# Patient Record
Sex: Male | Born: 1965 | Race: White | Hispanic: No | Marital: Married | Smoking: Never smoker
Health system: Southern US, Community
[De-identification: ages and names within clinical notes are randomized; demographics above are authoritative.]

---

## 2012-10-24 ENCOUNTER — Emergency Department: Payer: Self-pay | Admitting: Emergency Medicine

## 2012-10-25 ENCOUNTER — Emergency Department: Payer: Self-pay | Admitting: Emergency Medicine

## 2013-05-27 ENCOUNTER — Emergency Department: Payer: Self-pay | Admitting: Emergency Medicine

## 2013-09-09 ENCOUNTER — Ambulatory Visit: Payer: Self-pay | Admitting: Specialist

## 2015-04-27 ENCOUNTER — Emergency Department: Payer: Medicaid Other

## 2015-04-27 ENCOUNTER — Emergency Department
Admission: EM | Admit: 2015-04-27 | Discharge: 2015-04-27 | Disposition: A | Discharge status: Home / Self Care | Payer: Medicaid Other | Attending: Emergency Medicine | Admitting: Emergency Medicine

## 2015-04-27 ENCOUNTER — Other Ambulatory Visit: Payer: Self-pay

## 2015-04-27 ENCOUNTER — Encounter: Payer: Self-pay | Admitting: Emergency Medicine

## 2015-04-27 DIAGNOSIS — Z791 Long term (current) use of non-steroidal anti-inflammatories (NSAID): Secondary | ICD-10-CM | POA: Insufficient documentation

## 2015-04-27 DIAGNOSIS — R1031 Right lower quadrant pain: Secondary | ICD-10-CM

## 2015-04-27 DIAGNOSIS — J181 Lobar pneumonia, unspecified organism: Secondary | ICD-10-CM

## 2015-04-27 DIAGNOSIS — J189 Pneumonia, unspecified organism: Secondary | ICD-10-CM | POA: Diagnosis not present

## 2015-04-27 DIAGNOSIS — Z792 Long term (current) use of antibiotics: Secondary | ICD-10-CM | POA: Diagnosis not present

## 2015-04-27 LAB — CBC WITH DIFFERENTIAL/PLATELET
Basophils Absolute: 0.1 10*3/uL (ref 0–0.1)
Basophils Relative: 1 %
Eosinophils Absolute: 0.8 10*3/uL — ABNORMAL HIGH (ref 0–0.7)
Eosinophils Relative: 8 %
HCT: 43.5 % (ref 40.0–52.0)
Hemoglobin: 14.4 g/dL (ref 13.0–18.0)
LYMPHS ABS: 3.4 10*3/uL (ref 1.0–3.6)
Lymphocytes Relative: 37 %
MCH: 28.3 pg (ref 26.0–34.0)
MCHC: 33.1 g/dL (ref 32.0–36.0)
MCV: 85.6 fL (ref 80.0–100.0)
Monocytes Absolute: 0.7 10*3/uL (ref 0.2–1.0)
Monocytes Relative: 7 %
NEUTROS ABS: 4.3 10*3/uL (ref 1.4–6.5)
NEUTROS PCT: 47 %
Platelets: 277 10*3/uL (ref 150–440)
RBC: 5.09 MIL/uL (ref 4.40–5.90)
RDW: 14.4 % (ref 11.5–14.5)
WBC: 9.2 10*3/uL (ref 3.8–10.6)

## 2015-04-27 LAB — URINALYSIS COMPLETE WITH MICROSCOPIC (ARMC ONLY)
BILIRUBIN URINE: NEGATIVE
Bacteria, UA: NONE SEEN
Glucose, UA: NEGATIVE mg/dL
HGB URINE DIPSTICK: NEGATIVE
KETONES UR: NEGATIVE mg/dL
Leukocytes, UA: NEGATIVE
NITRITE: NEGATIVE
Protein, ur: NEGATIVE mg/dL
RBC / HPF: NONE SEEN RBC/hpf (ref 0–5)
SPECIFIC GRAVITY, URINE: 1.009 (ref 1.005–1.030)
WBC UA: NONE SEEN WBC/hpf (ref 0–5)
pH: 7 (ref 5.0–8.0)

## 2015-04-27 LAB — COMPREHENSIVE METABOLIC PANEL
ALT: 20 U/L (ref 17–63)
AST: 23 U/L (ref 15–41)
Albumin: 4.1 g/dL (ref 3.5–5.0)
Alkaline Phosphatase: 90 U/L (ref 38–126)
Anion gap: 10 (ref 5–15)
BUN: 12 mg/dL (ref 6–20)
CALCIUM: 9 mg/dL (ref 8.9–10.3)
CO2: 26 mmol/L (ref 22–32)
CREATININE: 0.89 mg/dL (ref 0.61–1.24)
Chloride: 103 mmol/L (ref 101–111)
GFR calc Af Amer: >60 mL/min (ref >60)
Glucose, Bld: 106 mg/dL — ABNORMAL HIGH (ref 65–99)
Potassium: 3.8 mmol/L (ref 3.5–5.1)
SODIUM: 139 mmol/L (ref 135–145)
Total Bilirubin: 0.4 mg/dL (ref 0.3–1.2)
Total Protein: 6.9 g/dL (ref 6.5–8.1)

## 2015-04-27 LAB — LIPASE, BLOOD: Lipase: 30 U/L (ref 22–51)

## 2015-04-27 MED ORDER — LEVOFLOXACIN 750 MG PO TABS: 750.0000 mg | ORAL_TABLET | Freq: Every day | ORAL | Status: AC

## 2015-04-27 MED ORDER — ONDANSETRON 4 MG PO TBDP: 8.0000 mg | ORAL_TABLET | ORAL | Status: DC

## 2015-04-27 MED ORDER — ONDANSETRON 8 MG PO TBDP
ORAL_TABLET | ORAL | Status: AC
  Filled 2015-04-27: qty 1

## 2015-04-27 MED ORDER — NAPROXEN 500 MG PO TABS: 500.0000 mg | ORAL_TABLET | Freq: Two times a day (BID) | ORAL | Status: AC

## 2015-04-27 NOTE — ED Provider Notes (Signed)
[facility] Emergency Department Provider Note  ____________________________________________  Time seen: 4:45 PM  I have reviewed the triage vital signs and the nursing notes.   HISTORY  Chief Complaint Abdominal Pain    HPI Mason Huber is a 49 y.o. male who complains of right lower quadrant abdominal pain since yesterday. The pain initially started in the mid and lower abdomen, but then as the day has gone today. It is glasses off to the right lower quadrant. It is waxing and waning currently mild but has been very severe at times. Normal appetite. No vomiting or diarrhea. No fever, chills, chest pain, shortness of breath. He did have a cough for the last 2 days that is persistent and has been feeling fatigued for 24 hours.  The pain is constant, waxing and waning, sharp, nonradiating. No aggravating or alleviating factors.   History reviewed. No pertinent past medical history.  There are no active problems to display for this patient.   History reviewed. No pertinent past surgical history.  Current Outpatient Rx  Name  Route  Sig  Dispense  Refill  . levofloxacin (LEVAQUIN) 750 MG tablet   Oral   Take 1 tablet (750 mg total) by mouth daily.   7 tablet   0   . naproxen (NAPROSYN) 500 MG tablet   Oral   Take 1 tablet (500 mg total) by mouth 2 (two) times daily with a meal.   20 tablet   0     Allergies Review of patient's allergies indicates no known allergies.  History reviewed. No pertinent family history.  Social History History  Substance Use Topics  . Smoking status: Never Smoker   . Smokeless tobacco: Not on file  . Alcohol Use: No    Review of Systems  Constitutional: No fever or chills. No weight changes Eyes:No blurry vision or double vision.  ENT: No sore throat. Cardiovascular: No chest pain. Respiratory: Nonproductive cough Gastrointestinal: Right lower quadrant abdominal pain as above. No vomiting or diarrhea.   No BRBPR or melena. Genitourinary: Negative for dysuria, urinary retention, bloody urine, or difficulty urinating. Musculoskeletal: Negative for back pain. No joint swelling or pain. Skin: Negative for rash. Neurological: Negative for headaches, focal weakness or numbness. Psychiatric:No anxiety or depression.   Endocrine:No hot/cold intolerance, changes in energy, or sleep difficulty.  10-point ROS otherwise negative.  ____________________________________________   PHYSICAL EXAM:  VITAL SIGNS: ED Triage Vitals  Enc Vitals Group     BP 04/27/15 1600 147/94 mmHg     Pulse Rate 04/27/15 1600 79     Resp 04/27/15 1600 18     Temp 04/27/15 1600 98.6 F (37 C)     Temp Source 04/27/15 1600 Oral     SpO2 04/27/15 1600 97 %     Weight 04/27/15 1600 185 lb (83.915 kg)     Height 04/27/15 1600 5\' 6"  (1.676 m)     Head Cir --      Peak Flow --      Pain Score 04/27/15 1601 1     Pain Loc --      Pain Edu? --      Excl. in GC? --      Constitutional: Alert and oriented. Well appearing and in no distress. Eyes: No scleral icterus. No conjunctival pallor. PERRL. EOMI ENT   Head: Normocephalic and atraumatic.   Nose: No congestion/rhinnorhea. No septal hematoma   Mouth/Throat: MMM, no pharyngeal erythema. No peritonsillar mass. No uvula shift.  Neck: No stridor. No SubQ emphysema. No meningismus. Hematological/Lymphatic/Immunilogical: No cervical lymphadenopathy. Cardiovascular: RRR. Normal and symmetric distal pulses are present in all extremities. No murmurs, rubs, or gallops. Respiratory: Normal respiratory effort without tachypnea nor retractions. Breath sounds are clear and equal bilaterally. No wheezes/rales/rhonchi. Gastrointestinal: Right lower quadrant abdominal tenderness. No distention. There is no CVA tenderness.  No rebound, rigidity, or guarding. Genitourinary: deferred Musculoskeletal: Nontender with normal range of motion in all extremities. No joint  effusions.  No lower extremity tenderness.  No edema. Neurologic:   Normal speech and language.  CN 2-10 normal. Motor grossly intact. No pronator drift.  Normal gait. No gross focal neurologic deficits are appreciated.  Skin:  Skin is warm, dry and intact. No rash noted.  No petechiae, purpura, or bullae. Psychiatric: Mood and affect are normal. Speech and behavior are normal. Patient exhibits appropriate insight and judgment.  ____________________________________________    LABS (pertinent positives/negatives) (all labs ordered are listed, but only abnormal results are displayed) Labs Reviewed  COMPREHENSIVE METABOLIC PANEL - Abnormal; Notable for the following:    Glucose, Bld 106 (*)    All other components within normal limits  CBC WITH DIFFERENTIAL/PLATELET - Abnormal; Notable for the following:    Eosinophils Absolute 0.8 (*)    All other components within normal limits  LIPASE, BLOOD  URINALYSIS COMPLETEWITH MICROSCOPIC ([facility])    ____________________________________________   EKG   Date: 04/27/2015  Rate: 74  Rhythm: normal sinus rhythm  QRS Axis: normal  Intervals: normal  ST/T Wave abnormalities: normal  Conduction Disutrbances: none  Narrative Interpretation: unremarkable      ____________________________________________    RADIOLOGY  CT abdomen and pelvis unremarkable. No appendicitis or other acute pathology. No ureterolithiasis. I discussed with the radiologist who did note there is a right lower lobe pneumonia on the visualized portions of the lower lungs.  ____________________________________________   PROCEDURES  ____________________________________________   INITIAL IMPRESSION / ASSESSMENT AND PLAN / ED COURSE  Pertinent labs & imaging results that were available during my care of the patient were reviewed by me and considered in my medical decision making (see chart for details).  Patient presents with concern for appendicitis  versus renal colic. We'll get a CT scan, as well as check his blood work.  ----------------------------------------- 6:41 PM on 04/27/2015 -----------------------------------------  Patient well appearing, no acute distress, has been stable during his workup. His blood work and CT scan unremarkable except for the incidental finding of pneumonia in the right lung. Given his mild symptoms. We will go ahead and treat him. He has been counseled on possible early appendicitis and the need to monitor his symptoms and to return to the ED if his condition worsens. He is otherwise medically stable and suitable for discharge at this time. No evidence of torsion, volvulus, sepsis, abscess, cholecystitis, AAA, dissection or other cardiopulmonary pathology other than the incidental pneumonia.  ____________________________________________   FINAL CLINICAL IMPRESSION(S) / ED DIAGNOSES  Final diagnoses:  Abdominal pain, acute, right lower quadrant  Right lower lobe pneumonia      Sharman CheekPhillip Chidiebere Wynn, MD 04/27/15 1842

## 2015-04-27 NOTE — ED Notes (Signed)
Pt reports right groin pain and  RLQ pain off and on since yesterday.  States it is a 1/10 right now but sometimes a 8/20.  Skin w/d with good color

## 2015-04-27 NOTE — Discharge Instructions (Signed)

## 2016-04-30 IMAGING — CT CT RENAL STONE PROTOCOL
1 of 2 series · 15 of 32 positions shown, 19 images · non-contrast
Comparison: Lumbar radiographs dated 10/24/2012

ADDENDUM:
The patient has a patchy pneumonia in the right lower lobe just
above the right hemidiaphragm. Critical Value/emergent results were
called by telephone at the time of interpretation on 04/27/2015 at
[DATE] to Dr. NORALIA REBECCA , who verbally acknowledged these
results.
CLINICAL DATA: Right flank and groin pain with tenderness.

EXAM:
CT ABDOMEN AND PELVIS WITHOUT CONTRAST
TECHNIQUE: Multidetector CT imaging of the abdomen and pelvis was performed
following the standard protocol without IV contrast.

[Series 2: stone standard full · axial · 0.71mm/px · z∈[-1057,-642]mm · 15 of 91 slices shown, 19 images]
[im 4/91  soft-tissue]
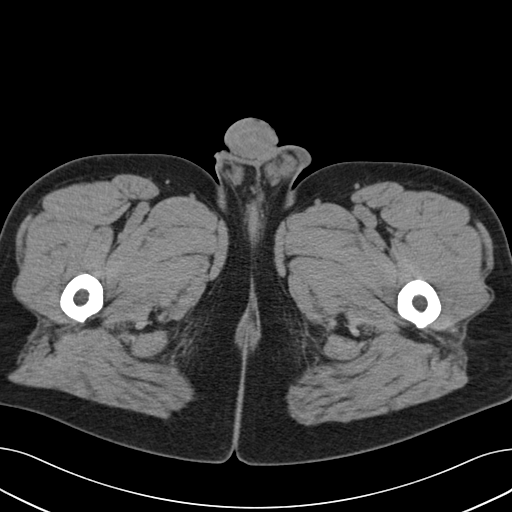
[im 4/91  bone]
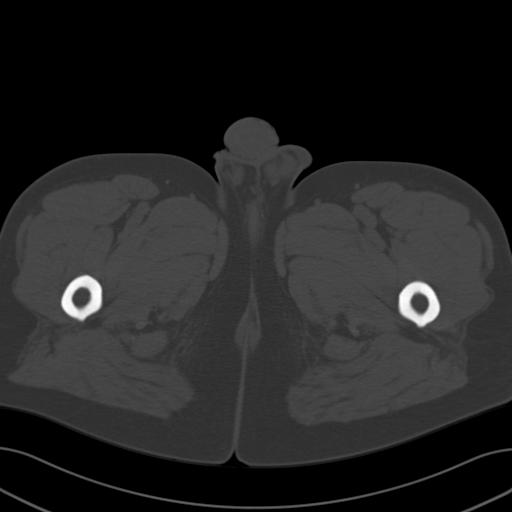
[im 12/91  soft-tissue]
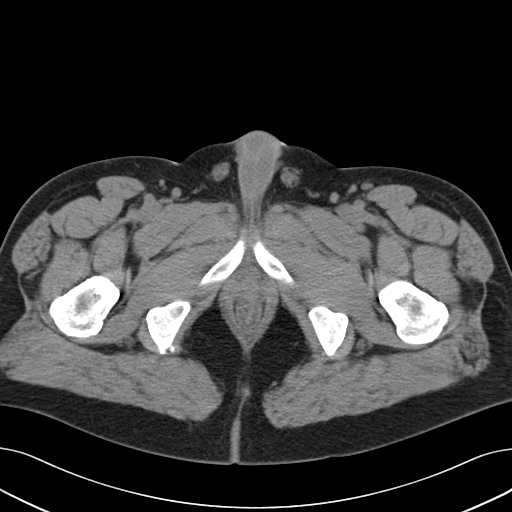
[im 19/91  soft-tissue]
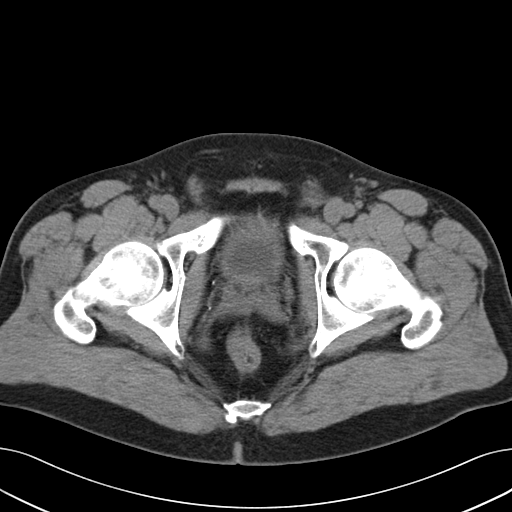
[im 27/91  soft-tissue]
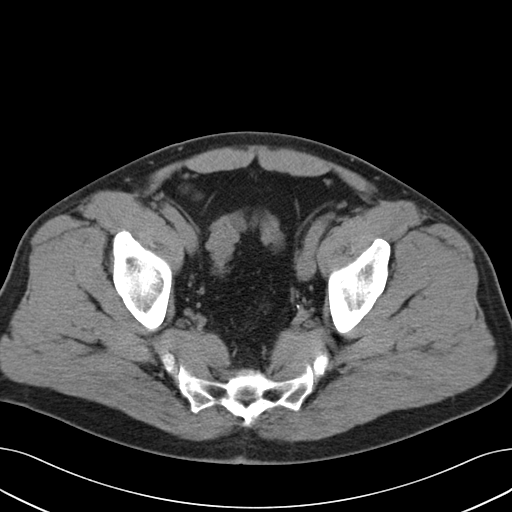
[im 31/91  soft-tissue]
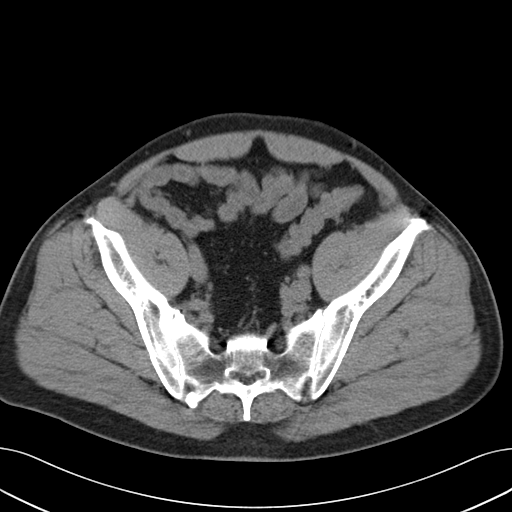
[im 38/91  soft-tissue]
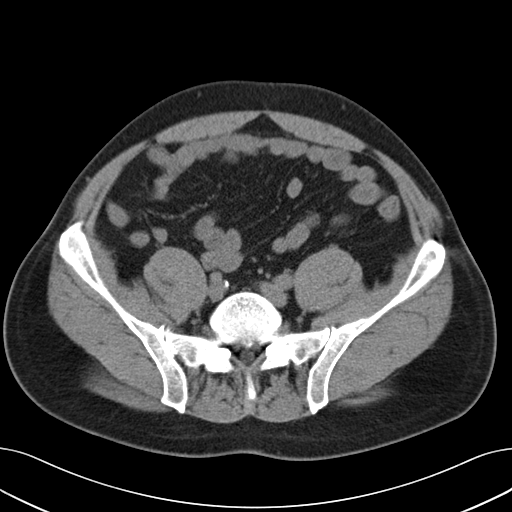
[im 46/91  soft-tissue]
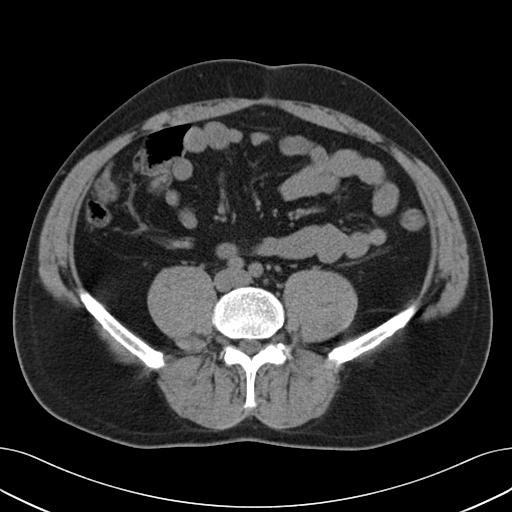
[im 53/91  soft-tissue]
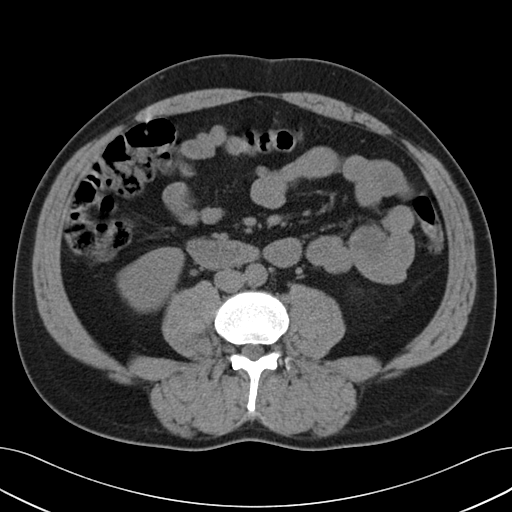
[im 61/91  soft-tissue]
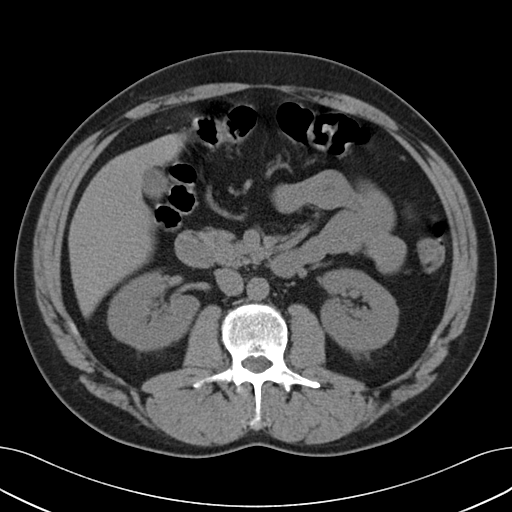
[im 61/91  bone]
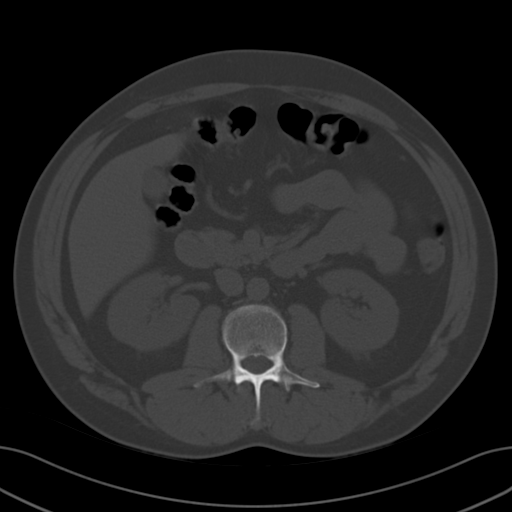
[im 64/91  soft-tissue]
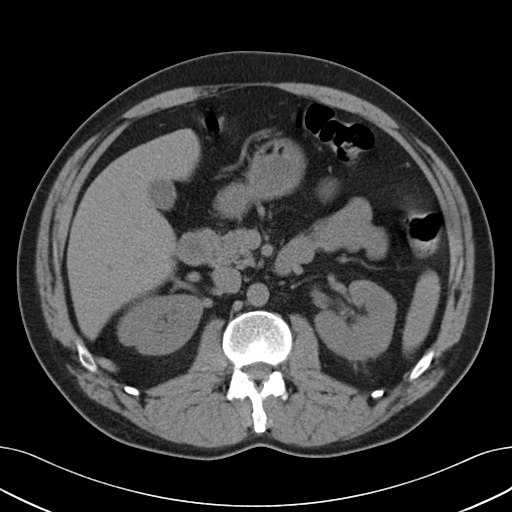
[im 72/91  soft-tissue]
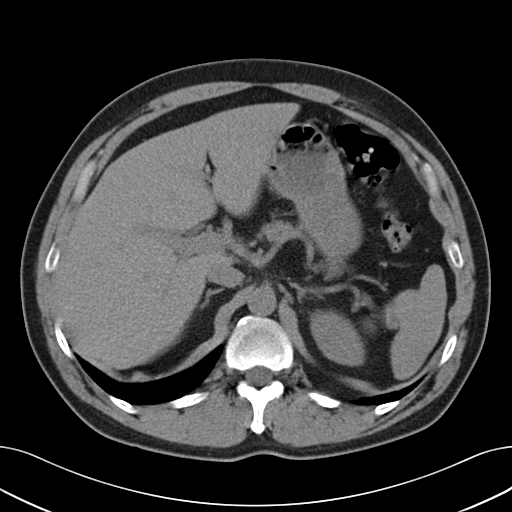
[im 76/91  lung]
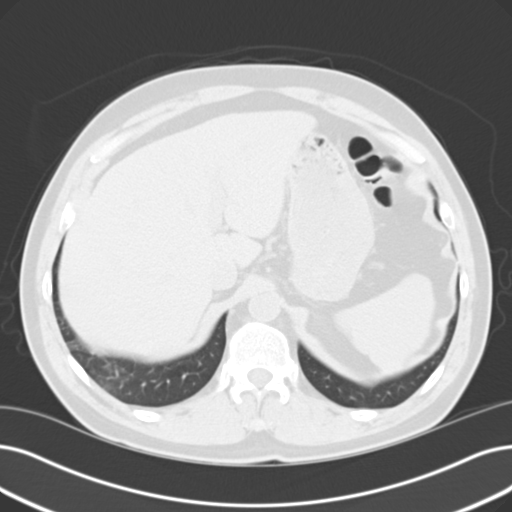
[im 79/91  soft-tissue]
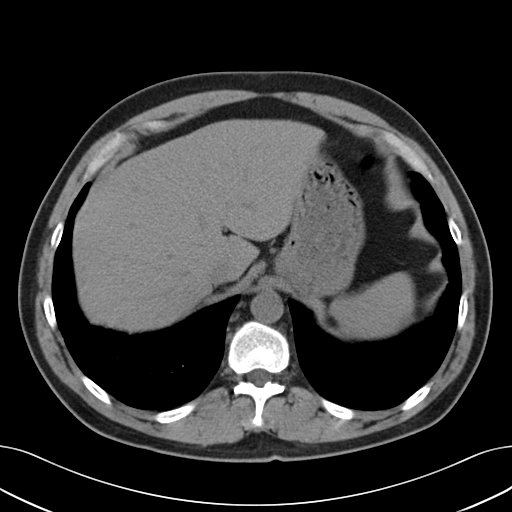
[im 79/91  lung]
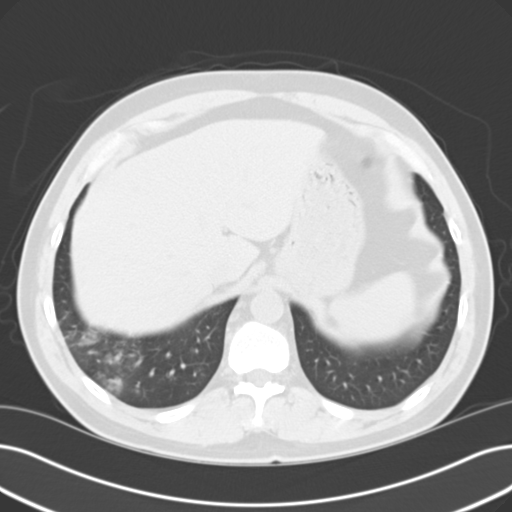
[im 83/91  lung]
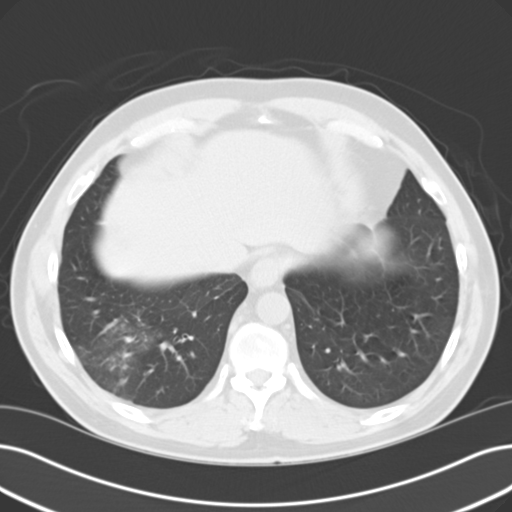
[im 87/91  soft-tissue]
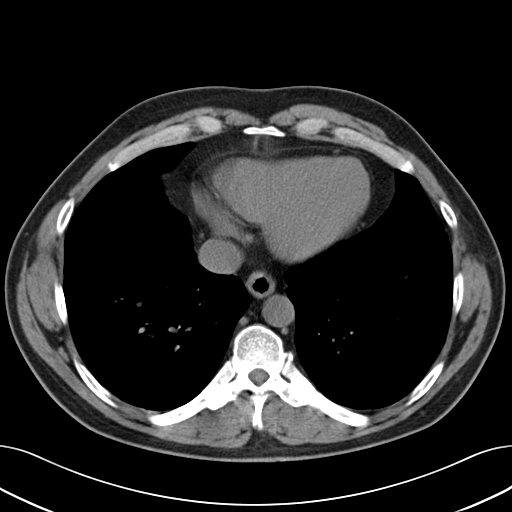
[im 87/91  lung]
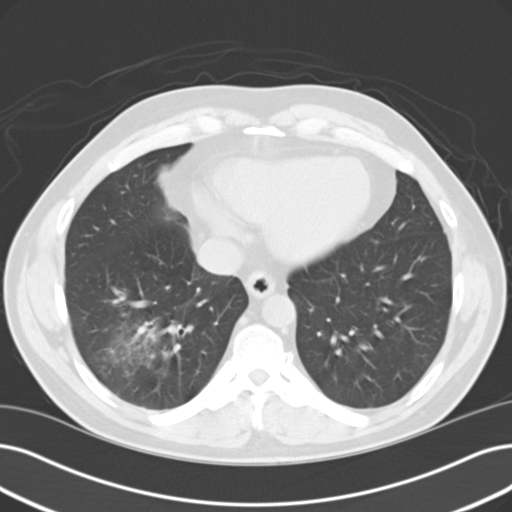

[15 of 32 positions shown; findings below may reference images not displayed]

FINDINGS: The liver, biliary tree, spleen, pancreas, adrenal glands, and
kidneys appear normal. There are no renal or ureteral calculi. No
bladder calculi. Prostate gland is not enlarged. The bowel appears
normal including the terminal ileum and appendix. No free air or
free fluid. No adenopathy. There are few calcifications in the iliac
vessels.

No osseous abnormality.
IMPRESSION: Benign appearing abdomen and pelvis.

## 2017-01-11 DEATH — deceased
